# Patient Record
Sex: Female | Born: 1970 | Race: Black or African American | Hispanic: No | Marital: Married | Smoking: Never smoker
Health system: Southern US, Community
[De-identification: ages and names within clinical notes are randomized; demographics above are authoritative.]

## PROBLEM LIST (undated history)

## (undated) HISTORY — PX: REDUCTION MAMMAPLASTY: SUR839

---

## 2004-02-15 ENCOUNTER — Emergency Department (HOSPITAL_COMMUNITY): Admission: EM | Admit: 2004-02-15 | Discharge: 2004-02-15 | Payer: Self-pay | Admitting: Family Medicine

## 2004-05-23 ENCOUNTER — Emergency Department (HOSPITAL_COMMUNITY): Admission: EM | Admit: 2004-05-23 | Discharge: 2004-05-24 | Payer: Self-pay | Admitting: Emergency Medicine

## 2004-06-25 ENCOUNTER — Other Ambulatory Visit: Admission: RE | Admit: 2004-06-25 | Discharge: 2004-06-25 | Payer: Self-pay | Admitting: Internal Medicine

## 2004-12-14 ENCOUNTER — Encounter: Admission: RE | Admit: 2004-12-14 | Discharge: 2004-12-14 | Payer: Self-pay | Admitting: Internal Medicine

## 2007-05-16 ENCOUNTER — Emergency Department (HOSPITAL_COMMUNITY): Admission: EM | Admit: 2007-05-16 | Discharge: 2007-05-16 | Payer: Self-pay | Admitting: Emergency Medicine

## 2010-11-06 LAB — DIFFERENTIAL
Basophils Absolute: 0
Basophils Relative: 1
Eosinophils Absolute: 0.2
Eosinophils Relative: 5
Lymphocytes Relative: 33
Lymphs Abs: 1.3
Monocytes Absolute: 0.3
Monocytes Relative: 7
Neutro Abs: 2.1
Neutrophils Relative %: 55

## 2010-11-06 LAB — URINALYSIS, ROUTINE W REFLEX MICROSCOPIC
Bilirubin Urine: NEGATIVE
Glucose, UA: NEGATIVE
Hgb urine dipstick: NEGATIVE
Ketones, ur: NEGATIVE
Nitrite: NEGATIVE
Protein, ur: NEGATIVE
Specific Gravity, Urine: 1.023
Urobilinogen, UA: 0.2
pH: 6.5

## 2010-11-06 LAB — CBC
HCT: 40.4
Hemoglobin: 14.3
MCHC: 35.4
MCV: 82.6
Platelets: 209
RBC: 4.89
RDW: 14.1
WBC: 3.8 — ABNORMAL LOW

## 2010-11-06 LAB — COMPREHENSIVE METABOLIC PANEL
ALT: 18
AST: 19
Albumin: 3.9
Alkaline Phosphatase: 56
BUN: 5 — ABNORMAL LOW
CO2: 22
Calcium: 8.9
Chloride: 106
Creatinine, Ser: 0.68
GFR calc Af Amer: >60
GFR calc non Af Amer: >60
Glucose, Bld: 89
Potassium: 3.4 — ABNORMAL LOW
Sodium: 137
Total Bilirubin: 1
Total Protein: 7.2

## 2010-11-06 LAB — SEDIMENTATION RATE: Sed Rate: 6

## 2010-11-06 LAB — PREGNANCY, URINE: Preg Test, Ur: NEGATIVE

## 2013-06-24 ENCOUNTER — Other Ambulatory Visit: Payer: Self-pay

## 2013-06-24 DIAGNOSIS — Z1231 Encounter for screening mammogram for malignant neoplasm of breast: Secondary | ICD-10-CM

## 2013-08-09 ENCOUNTER — Ambulatory Visit
Admission: RE | Admit: 2013-08-09 | Discharge: 2013-08-09 | Disposition: A | Discharge status: Home / Self Care | Payer: PRIVATE HEALTH INSURANCE | Source: Ambulatory Visit

## 2013-08-09 DIAGNOSIS — Z1231 Encounter for screening mammogram for malignant neoplasm of breast: Secondary | ICD-10-CM

## 2013-08-20 ENCOUNTER — Encounter: Payer: Self-pay | Admitting: Gynecology

## 2014-09-27 ENCOUNTER — Other Ambulatory Visit: Payer: Self-pay

## 2014-09-27 DIAGNOSIS — Z1231 Encounter for screening mammogram for malignant neoplasm of breast: Secondary | ICD-10-CM

## 2014-09-28 ENCOUNTER — Ambulatory Visit: Payer: PRIVATE HEALTH INSURANCE

## 2014-09-28 ENCOUNTER — Ambulatory Visit
Admission: RE | Admit: 2014-09-28 | Discharge: 2014-09-28 | Disposition: A | Discharge status: Home / Self Care | Payer: PRIVATE HEALTH INSURANCE | Source: Ambulatory Visit

## 2014-09-28 DIAGNOSIS — Z1231 Encounter for screening mammogram for malignant neoplasm of breast: Secondary | ICD-10-CM

## 2014-09-30 ENCOUNTER — Other Ambulatory Visit: Payer: Self-pay | Admitting: Family Medicine

## 2014-09-30 DIAGNOSIS — R928 Other abnormal and inconclusive findings on diagnostic imaging of breast: Secondary | ICD-10-CM

## 2014-10-06 ENCOUNTER — Ambulatory Visit
Admission: RE | Admit: 2014-10-06 | Discharge: 2014-10-06 | Disposition: A | Discharge status: Home / Self Care | Payer: PRIVATE HEALTH INSURANCE | Source: Ambulatory Visit | Attending: Family Medicine | Admitting: Family Medicine

## 2014-10-06 ENCOUNTER — Other Ambulatory Visit: Payer: PRIVATE HEALTH INSURANCE

## 2014-10-06 DIAGNOSIS — R928 Other abnormal and inconclusive findings on diagnostic imaging of breast: Secondary | ICD-10-CM

## 2019-06-21 ENCOUNTER — Other Ambulatory Visit: Payer: Self-pay | Admitting: Family Medicine

## 2019-06-21 DIAGNOSIS — Z1231 Encounter for screening mammogram for malignant neoplasm of breast: Secondary | ICD-10-CM

## 2019-07-13 ENCOUNTER — Other Ambulatory Visit: Payer: Self-pay

## 2019-07-13 ENCOUNTER — Ambulatory Visit
Admission: RE | Admit: 2019-07-13 | Discharge: 2019-07-13 | Disposition: A | Discharge status: Home / Self Care | Payer: 59 | Source: Ambulatory Visit | Attending: Family Medicine | Admitting: Family Medicine

## 2019-07-13 DIAGNOSIS — Z1231 Encounter for screening mammogram for malignant neoplasm of breast: Secondary | ICD-10-CM

## 2019-07-14 ENCOUNTER — Other Ambulatory Visit: Payer: Self-pay | Admitting: Family Medicine

## 2019-07-14 DIAGNOSIS — R928 Other abnormal and inconclusive findings on diagnostic imaging of breast: Secondary | ICD-10-CM

## 2019-07-26 ENCOUNTER — Ambulatory Visit
Admission: RE | Admit: 2019-07-26 | Discharge: 2019-07-26 | Disposition: A | Discharge status: Home / Self Care | Payer: 59 | Source: Ambulatory Visit | Attending: Family Medicine | Admitting: Family Medicine

## 2019-07-26 ENCOUNTER — Other Ambulatory Visit: Payer: Self-pay

## 2019-07-26 DIAGNOSIS — R928 Other abnormal and inconclusive findings on diagnostic imaging of breast: Secondary | ICD-10-CM

## 2020-06-20 ENCOUNTER — Other Ambulatory Visit: Payer: Self-pay | Admitting: Family Medicine

## 2020-06-20 DIAGNOSIS — Z1231 Encounter for screening mammogram for malignant neoplasm of breast: Secondary | ICD-10-CM

## 2020-08-17 ENCOUNTER — Other Ambulatory Visit: Payer: Self-pay

## 2020-08-17 ENCOUNTER — Ambulatory Visit
Admission: RE | Admit: 2020-08-17 | Discharge: 2020-08-17 | Disposition: A | Discharge status: Home / Self Care | Payer: 59 | Source: Ambulatory Visit | Attending: Family Medicine | Admitting: Family Medicine

## 2020-08-17 DIAGNOSIS — Z1231 Encounter for screening mammogram for malignant neoplasm of breast: Secondary | ICD-10-CM

## 2020-11-15 ENCOUNTER — Encounter (HOSPITAL_BASED_OUTPATIENT_CLINIC_OR_DEPARTMENT_OTHER): Payer: Self-pay

## 2020-11-15 ENCOUNTER — Other Ambulatory Visit: Payer: Self-pay

## 2020-11-15 ENCOUNTER — Emergency Department (HOSPITAL_BASED_OUTPATIENT_CLINIC_OR_DEPARTMENT_OTHER): Payer: 59

## 2020-11-15 ENCOUNTER — Emergency Department (HOSPITAL_BASED_OUTPATIENT_CLINIC_OR_DEPARTMENT_OTHER)
Admission: EM | Admit: 2020-11-15 | Discharge: 2020-11-15 | Disposition: A | Discharge status: Home / Self Care | Payer: 59 | Attending: Emergency Medicine | Admitting: Emergency Medicine

## 2020-11-15 DIAGNOSIS — M79651 Pain in right thigh: Secondary | ICD-10-CM | POA: Diagnosis not present

## 2020-11-15 DIAGNOSIS — M79604 Pain in right leg: Secondary | ICD-10-CM | POA: Insufficient documentation

## 2020-11-15 DIAGNOSIS — M25561 Pain in right knee: Secondary | ICD-10-CM | POA: Diagnosis not present

## 2020-11-15 NOTE — ED Notes (Signed)
Patient transported to Ultrasound via wc by Radiology Staff

## 2020-11-15 NOTE — ED Triage Notes (Signed)
Pt c/o right knee pain for the past few days. Denies injury. States was "hard on her upper calf this morning". Denies pain at arrival

## 2020-11-15 NOTE — Discharge Instructions (Addendum)
You were seen in the emergency department today for right leg pain.  We did an ultrasound of your leg today that showed no blood clot.  It did comment on some inflammation around the tendon in your knee, which could be due to overuse.    Normally I treat this with the RICE method including rest, ice, compression, elevation.  So I would continue wearing the compression stocking that she have, you can consider icing your knee when you get home from work.  Also recommend over-the-counter anti-inflammatories such as ibuprofen, Tylenol, Aleve.  I would discuss this with your primary doctor at your appointment next week.   Continue to monitor how you're doing and return to the ER for new or worsening symptoms such as new pain, swelling, difficulty moving your knee or difficulty walking. It has been a pleasure seeing and caring for you today and I hope you start feeling better soon!

## 2020-11-15 NOTE — ED Provider Notes (Signed)
[facility] EMERGENCY DEPARTMENT Provider Note   CSN: [id number] Arrival date & time: 11/15/20  1826     History Chief Complaint  Patient presents with   Knee Pain    Laura Willis is a 50 y.o. female presents with right leg pain x3 days.  She states her pain came on gradually and has been worsening.  She describes it as "feeling like my leg feels 300 pounds" with tightness and swelling.  Pain is diffuse in her right thigh, right lateral knee, and posterior calf. She notes that she has had long episodes of sitting while at work.  Earlier she tried wearing a compression stocking over her right knee and states that this helped improve her symptoms.  She has not taken any medication for this.  No trauma to the area.  No history of blood clots.  She does not take oral contraceptives and denies any recent travel.  No fever, chills, chest pain, shortness of breath, cough.   Knee Pain Associated symptoms: no fever       History reviewed. No pertinent past medical history.  There are no problems to display for this patient.   Past Surgical History:  Procedure Laterality Date   REDUCTION MAMMAPLASTY       OB History   No obstetric history on file.     Family History  Problem Relation Age of Onset   Breast cancer Sister 72    Social History   Tobacco Use   Smoking status: Never   Smokeless tobacco: Never  Substance Use Topics   Alcohol use: Never   Drug use: Never    Home Medications Prior to Admission medications   Not on File    Allergies    Patient has no known allergies.  Review of Systems   Review of Systems  Constitutional:  Negative for chills and fever.  Respiratory:  Negative for shortness of breath.   Cardiovascular:  Negative for chest pain.  Musculoskeletal:  Negative for gait problem.       Right thigh, knee, and calf pain  All other systems reviewed and are negative.  Physical Exam Updated Vital Signs BP (!) 159/108 (BP Location:  Right Arm)   Pulse 80   Temp 98.2 F (36.8 C) (Oral)   Resp 18   Ht 5\' 3"  (1.6 m)   Wt 93 kg   SpO2 100%   BMI 36.31 kg/m   Physical Exam Vitals and nursing note reviewed.  Constitutional:      Appearance: Normal appearance.  HENT:     Head: Normocephalic and atraumatic.  Eyes:     Conjunctiva/sclera: Conjunctivae normal.  Cardiovascular:     Rate and Rhythm: Normal rate and regular rhythm.  Pulmonary:     Effort: Pulmonary effort is normal. No respiratory distress.     Breath sounds: Normal breath sounds.  Abdominal:     General: There is no distension.     Palpations: Abdomen is soft.     Tenderness: There is no abdominal tenderness.  Musculoskeletal:     Comments: Full passive ROM of right knee, 5/5 strength. Sensation in tact. Pulses equal and normal in bilateral lower extremities. Mild swelling to the lateral knee without overlying signs of infection. No increased redness, warmth or tenderness. No deformities palpated.   Skin:    General: Skin is warm and dry.  Neurological:     General: No focal deficit present.     Mental Status: She is alert.  Psychiatric:  Mood and Affect: Mood normal.        Behavior: Behavior normal.    ED Results / Procedures / Treatments   Labs (all labs ordered are listed, but only abnormal results are displayed) Labs Reviewed - No data to display  EKG None  Radiology US Venous Img Lower Right (DVT Study)  Result Date: 11/15/2020 CLINICAL DATA:  Right leg pain and swelling. EXAM: Right LOWER EXTREMITY VENOUS DOPPLER ULTRASOUND TECHNIQUE: Gray-scale sonography with compression, as well as color and duplex ultrasound, were performed to evaluate the deep venous system(s) from the level of the common femoral vein through the popliteal and proximal calf veins. COMPARISON:  None. FINDINGS: VENOUS Normal compressibility of the common femoral, superficial femoral, and popliteal veins, as well as the visualized calf veins. Visualized  portions of profunda femoral vein and great saphenous vein unremarkable. No filling defects to suggest DVT on grayscale or color Doppler imaging. Doppler waveforms show normal direction of venous flow, normal respiratory plasticity and response to augmentation. Limited views of the contralateral common femoral vein are unremarkable. OTHER Likely 5 by 1 x 4.5 Cm hematoma formation within an anterior knee tendon. Limitations: none IMPRESSION: 1. Likely hematoma formation within an anterior knee tendon with concern for injury/rupture. 2. No right lower extremity deep venous thrombosis. Electronically Signed   By: Tish Frederickson M.D.   On: 11/15/2020 21:14    Procedures Procedures   Medications Ordered in ED Medications - No data to display  ED Course  I have reviewed the triage vital signs and the nursing notes.  Pertinent labs & imaging results that were available during my care of the patient were reviewed by me and considered in my medical decision making (see chart for details).    MDM Rules/Calculators/A&P                           Patient is 50 y/o female who presents with right leg pain x3 days.  Pain came on gradually and has been worsening.  She denies trauma or injury to the area.  She endorses long periods of sitting at work.  Has tried compression stocking over the area with mild relief. No fevers, chest pain, or shortness of breath.   On exam there is full ROM of right knee and 5/5 strength. Sensation in tact. Pulses equal and normal in BLE. Mild swelling over lateral right knee without signs of infection. Exam not consistent with septic joint as there is no increased warmth, tenderness, or skin changes.   [place] for DVT showed no evidence of clot. Some suspicion for hematoma over anterior knee tendon. History and exam not concerning for acute tendon injury or rupture, so this may be incidental finding. Symptoms likely could be caused by overuse. She is not requiring admission or patient  treatment for symptoms at this time.  Plan to treat with rest, ice, compression, and elevation as well as over-the-counter anti-inflammatories.  Patient has PCP appointment on Monday to discuss if symptoms persist.  Discussed reasons to return to the emergency department.  Patient agreeable to plan.  Final Clinical Impression(s) / ED Diagnoses Final diagnoses:  Right leg pain    Rx / DC Orders ED Discharge Orders     None        Lavonn Maxcy, Lora Paula, PA-C 11/15/20 2240    Arby Barrette, MD 11/29/20 1640

## 2020-11-15 NOTE — ED Notes (Signed)
AVS copy provided to client, reviewed AVS and reinforced instructions of resting, elevation and utilizing ice to aid in comfort measures. Opportunity for questions provided prior to DC to home.

## 2021-03-26 IMAGING — MG DIGITAL SCREENING BILAT W/ TOMO W/ CAD
8 series · 8 of 24 positions shown · non-contrast
Comparison: Previous exam(s).

CLINICAL DATA: Screening.

EXAM:
DIGITAL SCREENING BILATERAL MAMMOGRAM WITH TOMO AND CAD

[L CC synth-2D]
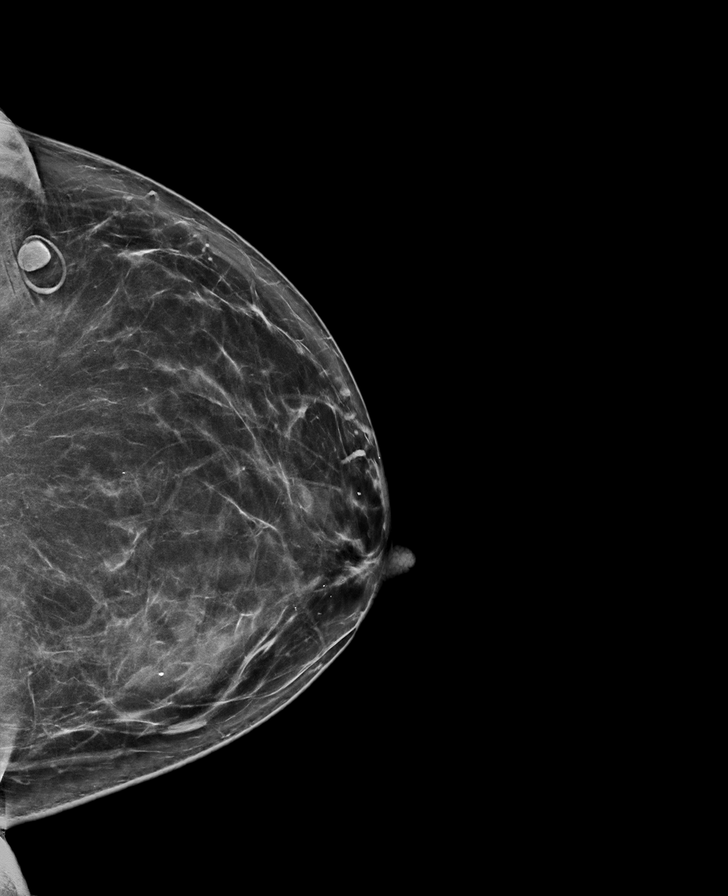

[R MLO synth-2D]
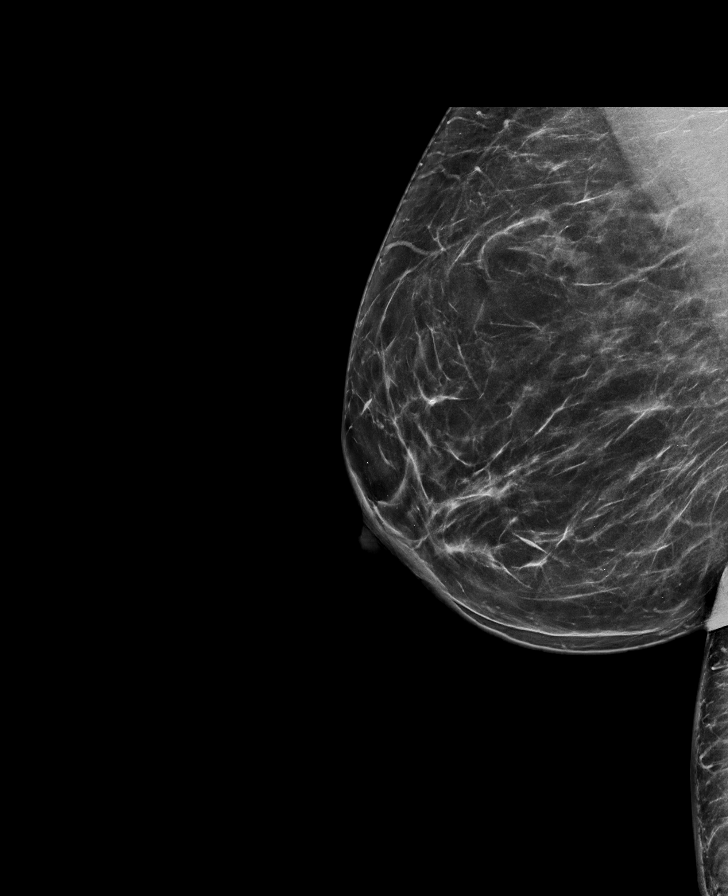

[R CC synth-2D]
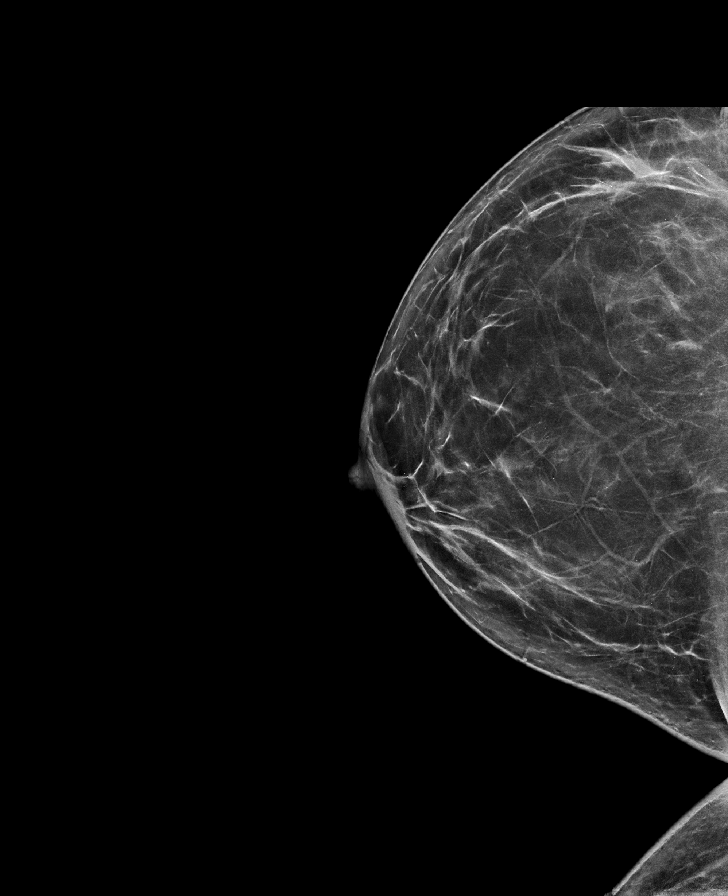

[L MLO synth-2D]
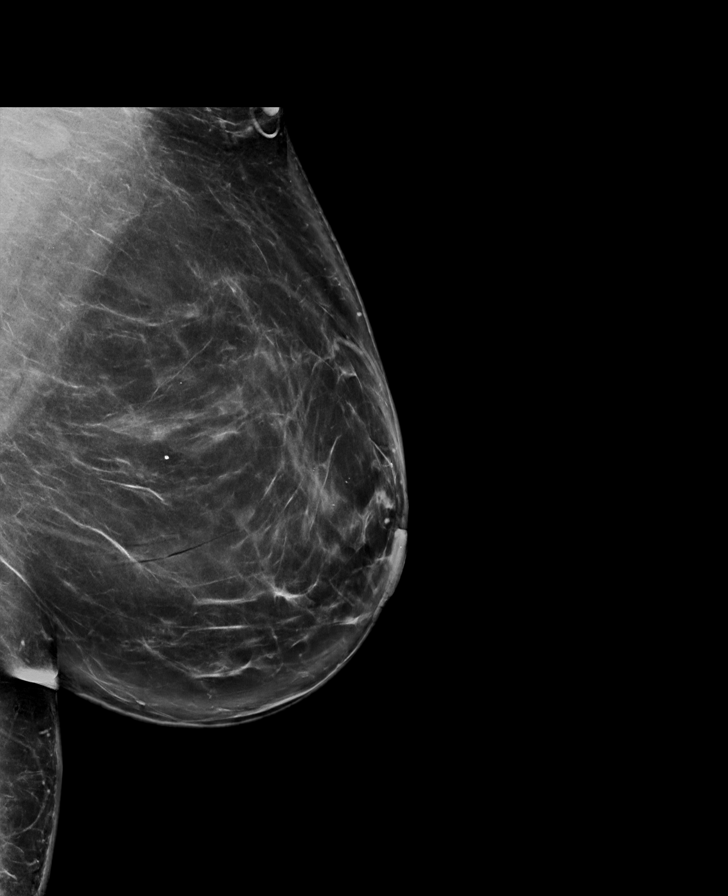

[L MLO tomo · tomo slice 51/101.0]
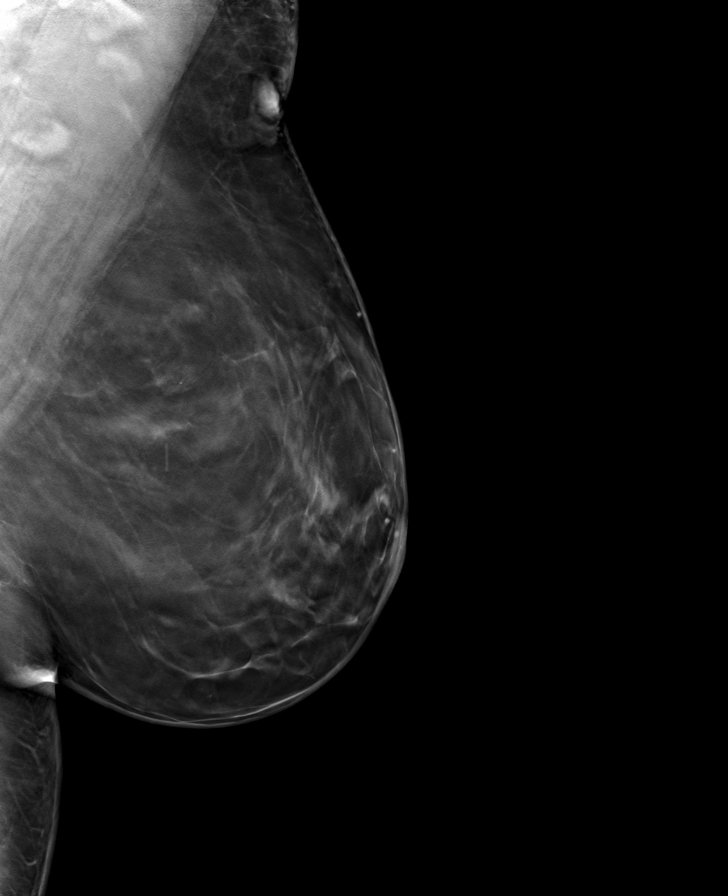

[R MLO tomo · tomo slice 41/81.0]
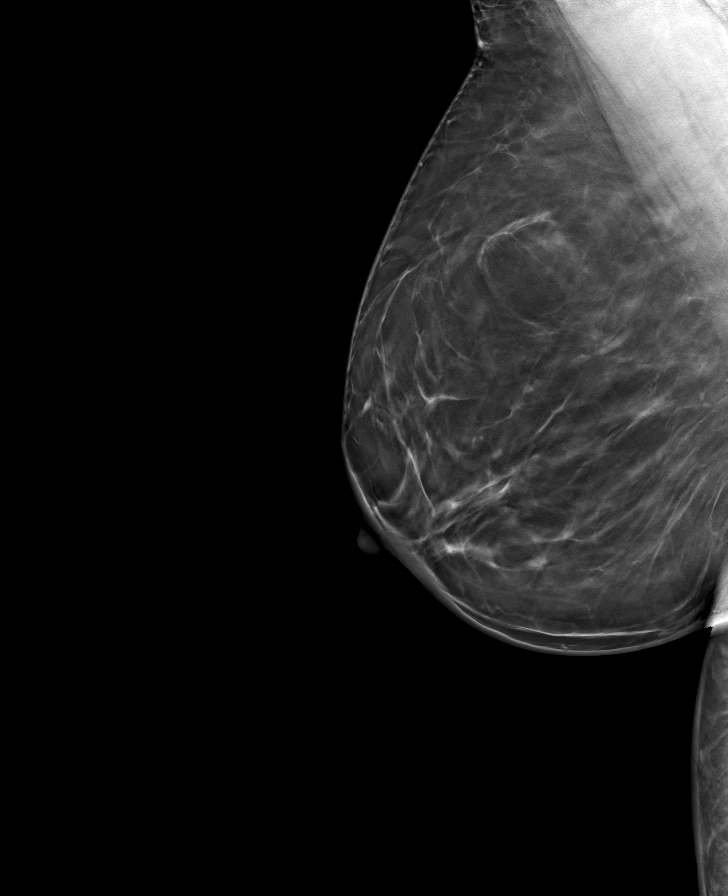

[R CC tomo · tomo slice 39/76.0]
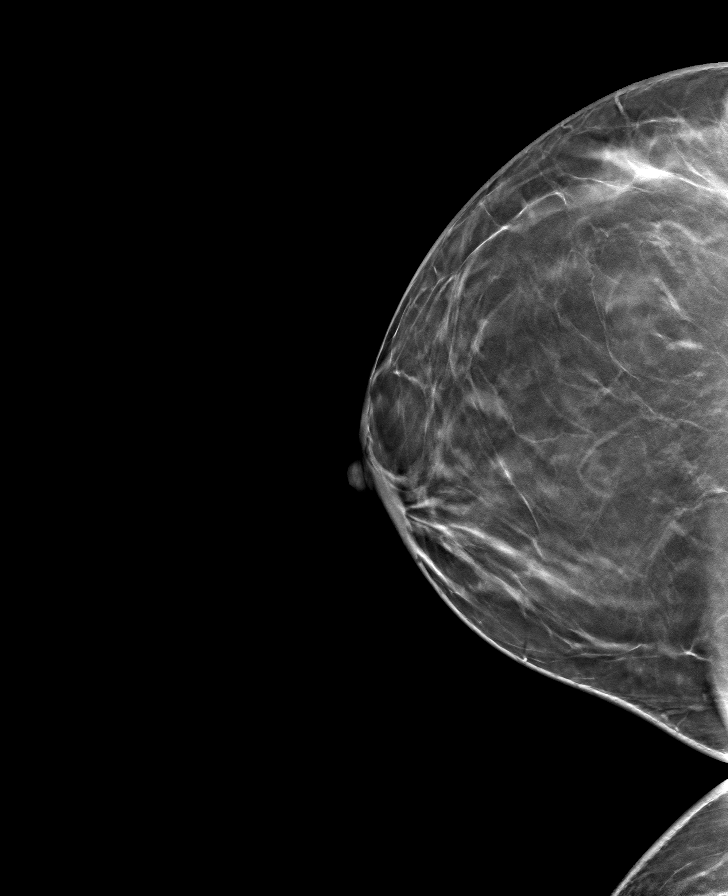

[L CC tomo · tomo slice 41/82.0]
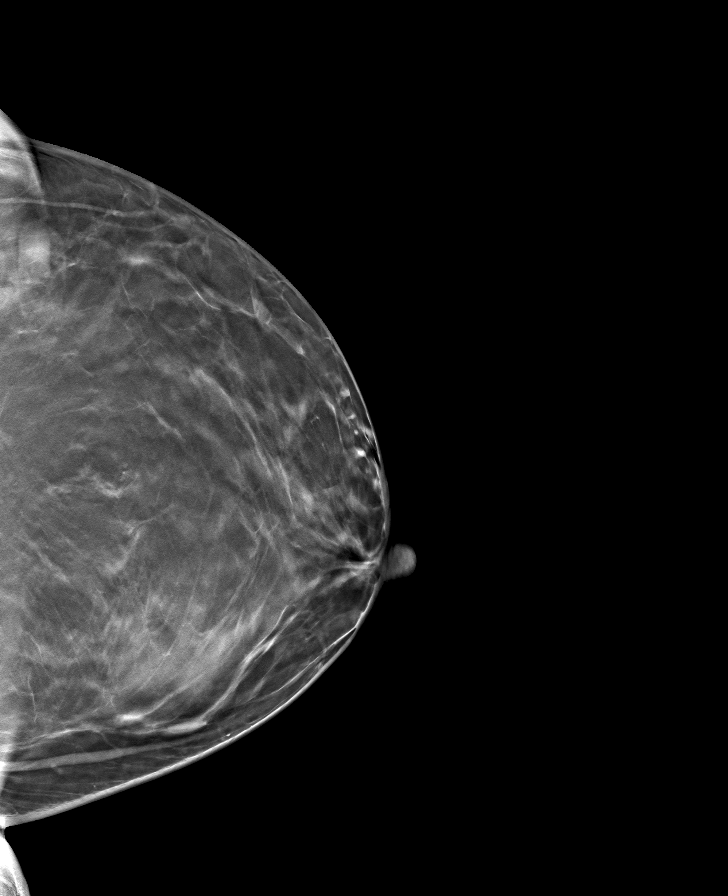

[8 of 24 positions shown; findings below may reference images not displayed]

ACR Breast Density Category c: The breast tissue is heterogeneously
dense, which may obscure small masses.
FINDINGS: In the left breast, a possible asymmetry warrants further
evaluation. In the right breast, no findings suspicious for
malignancy. Images were processed with CAD.
IMPRESSION: Further evaluation is suggested for possible asymmetry in the left
breast.

RECOMMENDATION:
Diagnostic mammogram and possibly ultrasound of the left breast.
(Code:[id number]-881)

The patient will be contacted regarding the findings, and additional
imaging will be scheduled.

BI-RADS CATEGORY  0: Incomplete. Need additional imaging evaluation
and/or prior mammograms for comparison.

## 2021-04-08 IMAGING — US US BREAST*L* LIMITED INC AXILLA
1 series · 7 of 7 positions shown · non-contrast
Comparison: Previous exam(s).

CLINICAL DATA: The patient was called back for a possible left
breast asymmetry.

EXAM:
DIGITAL DIAGNOSTIC LEFT MAMMOGRAM WITH TOMO
ULTRASOUND LEFT BREAST

[Series 1: us breast*left* limited inc axilla · 0.07mm/px · 7 of 7 slices shown]
[im 1/7]
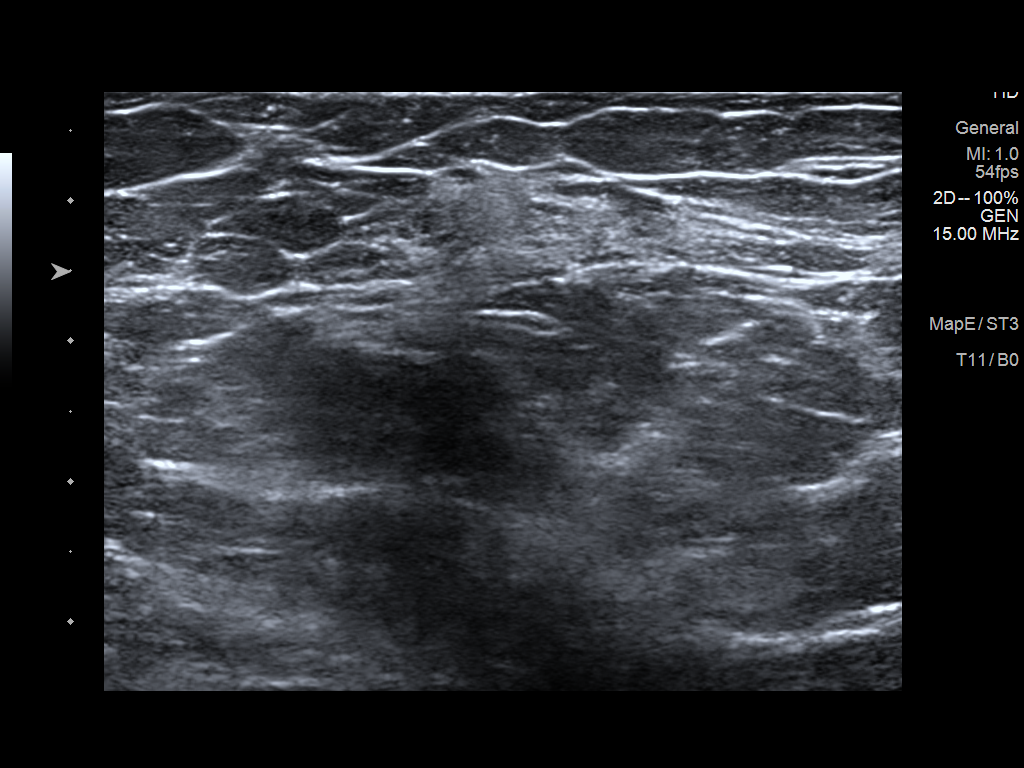
[im 2/7]
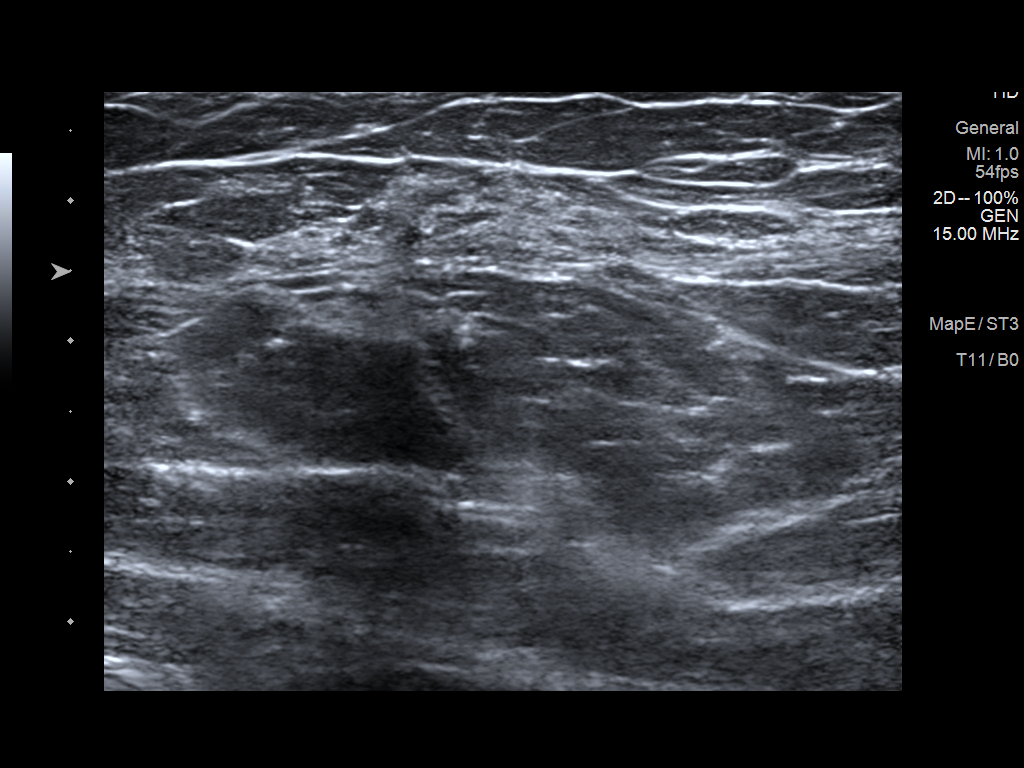
[im 3/7]
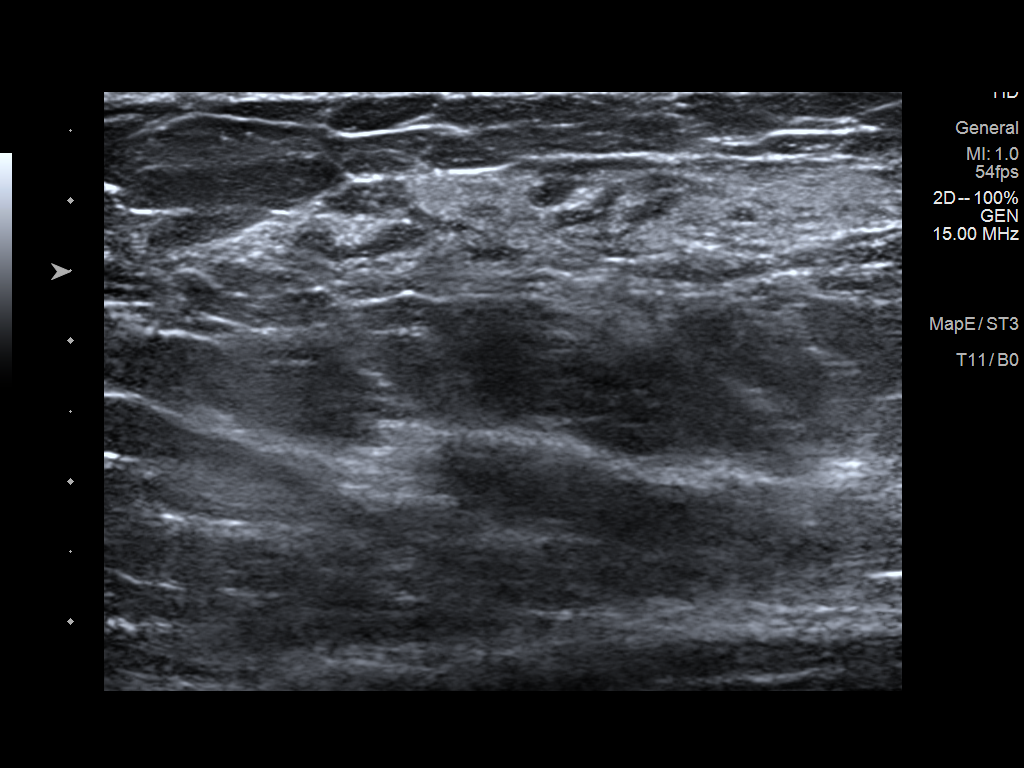
[im 4/7]
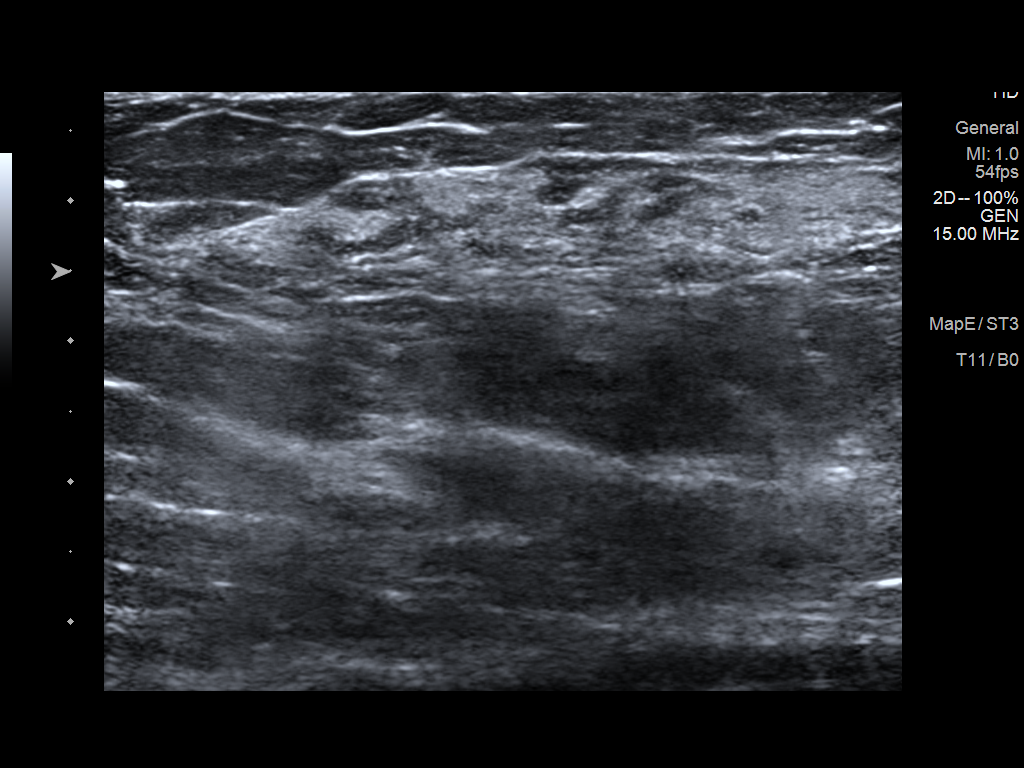
[im 5/7]
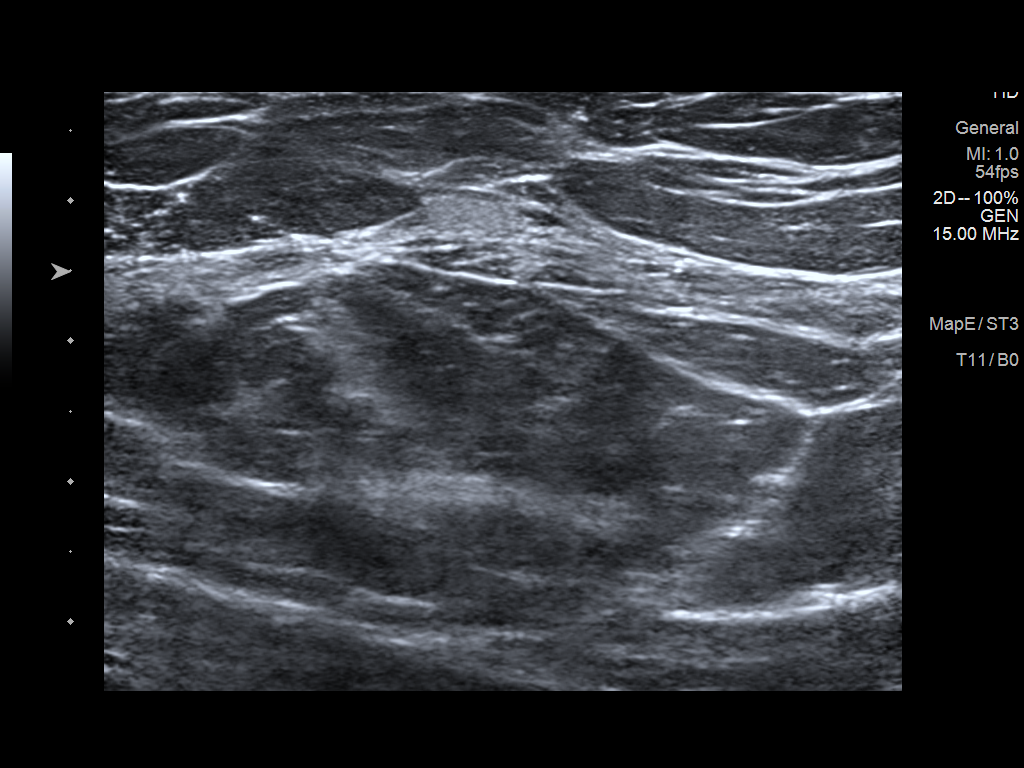
[im 6/7]
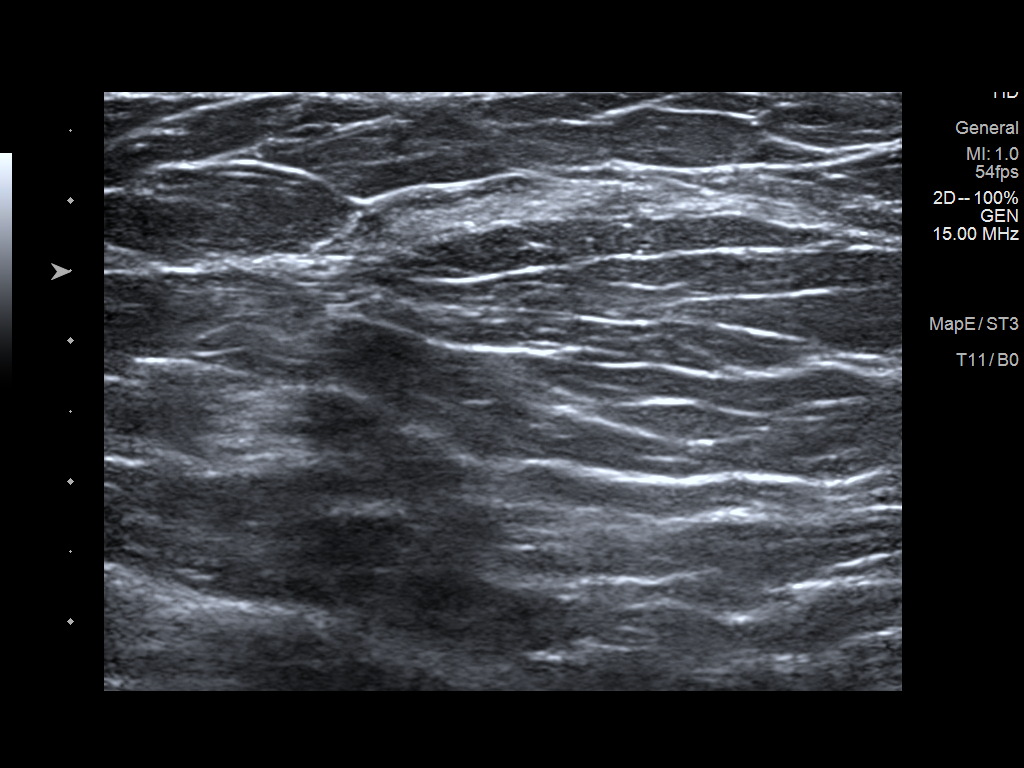
[im 7/7]
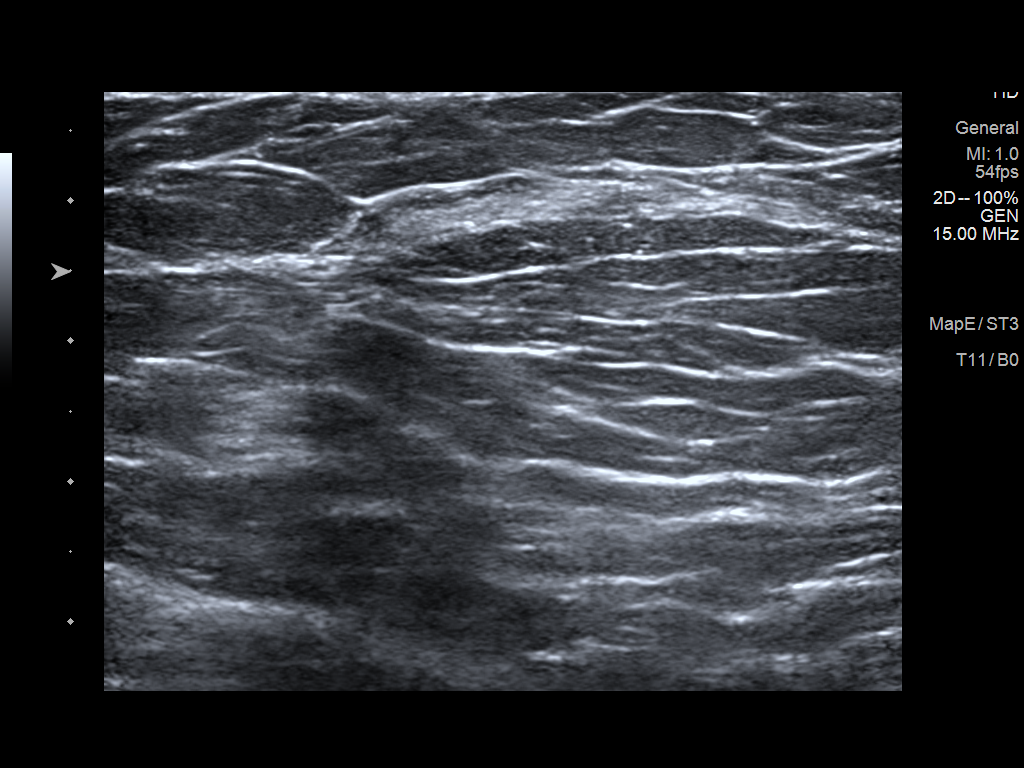

[7 of 7 positions shown; findings below may reference images not displayed]

ACR Breast Density Category b: There are scattered areas of
fibroglandular density.
FINDINGS: The left breast asymmetry improves on today's imaging, unchanged
since 7883.

On physical exam, no suspicious lumps are identified.

Targeted ultrasound is performed, showing no sonographic
abnormalities.
IMPRESSION: Benign, stable left breast asymmetry, unchanged for approximately 5
years.

RECOMMENDATION:
Annual screening mammography.

I have discussed the findings and recommendations with the patient.
If applicable, a reminder letter will be sent to the patient
regarding the next appointment.

BI-RADS CATEGORY  2: Benign.

## 2021-07-19 ENCOUNTER — Other Ambulatory Visit: Payer: Self-pay | Admitting: Family Medicine

## 2021-07-19 DIAGNOSIS — Z1231 Encounter for screening mammogram for malignant neoplasm of breast: Secondary | ICD-10-CM

## 2021-08-21 ENCOUNTER — Ambulatory Visit
Admission: RE | Admit: 2021-08-21 | Discharge: 2021-08-21 | Disposition: A | Discharge status: Home / Self Care | Payer: 59 | Source: Ambulatory Visit | Attending: Family Medicine | Admitting: Family Medicine

## 2021-08-21 DIAGNOSIS — Z1231 Encounter for screening mammogram for malignant neoplasm of breast: Secondary | ICD-10-CM

## 2022-07-30 IMAGING — US US EXTREM LOW VENOUS*R*
1 series · 14 of 24 positions shown · non-contrast
Comparison: None.

CLINICAL DATA: Right leg pain and swelling.

EXAM:
Right LOWER EXTREMITY VENOUS DOPPLER ULTRASOUND
TECHNIQUE: Gray-scale sonography with compression, as well as color and duplex
ultrasound, were performed to evaluate the deep venous system(s)
from the level of the common femoral vein through the popliteal and
proximal calf veins.

[Series 1: us extrem low venous*right* · 56 acquisitions, 14 frames shown]
[im 1/56]
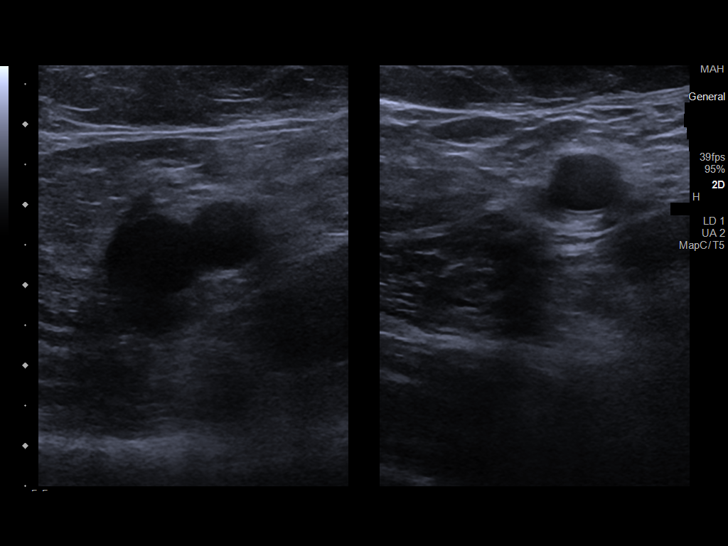
[im 5/56]
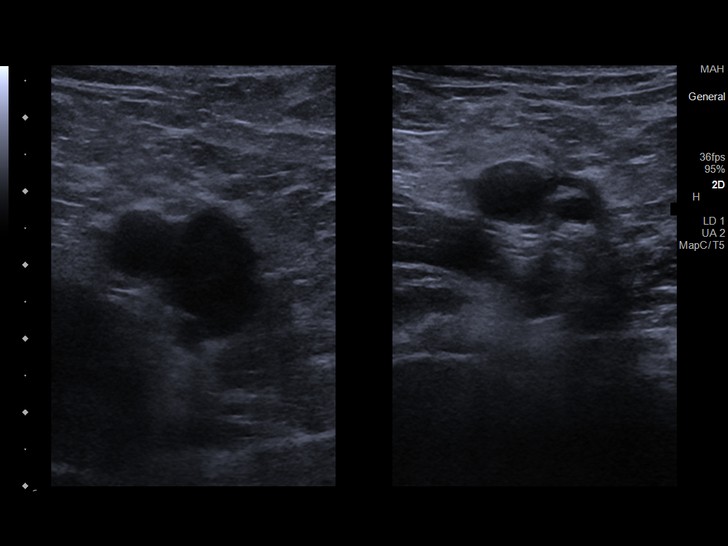
[im 10/56]
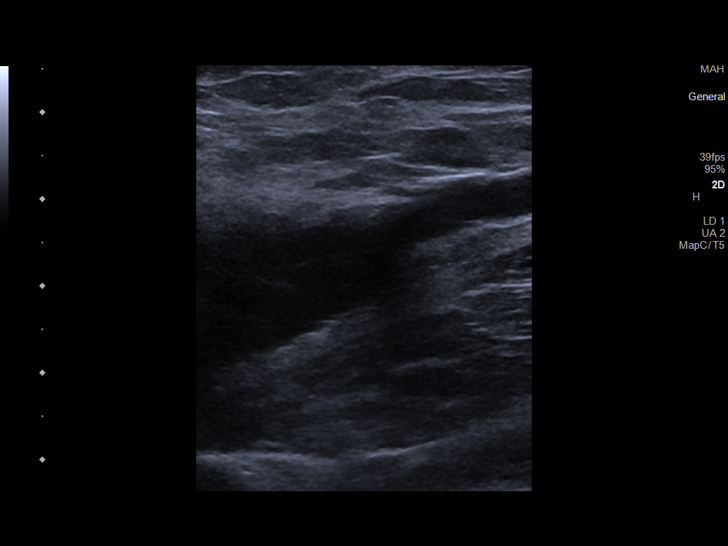
[im 17/56]
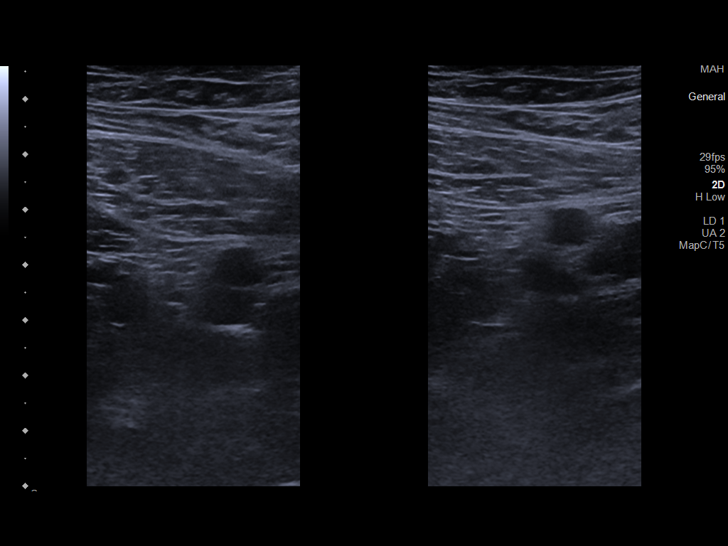
[im 20/56]
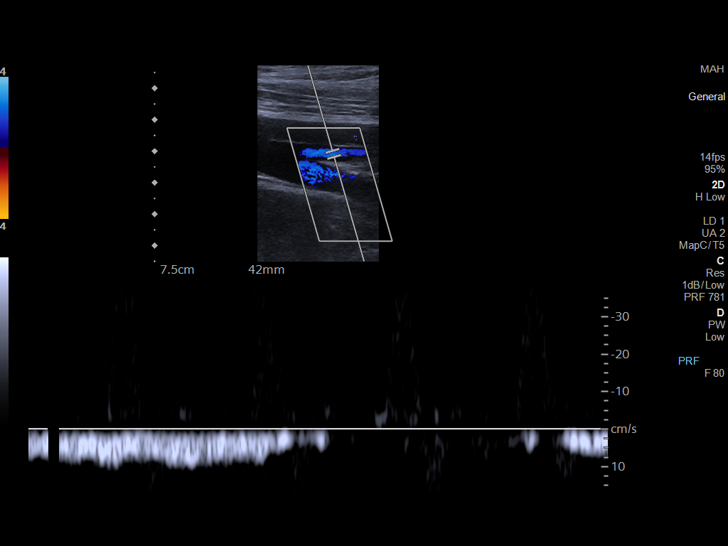
[im 24/56]
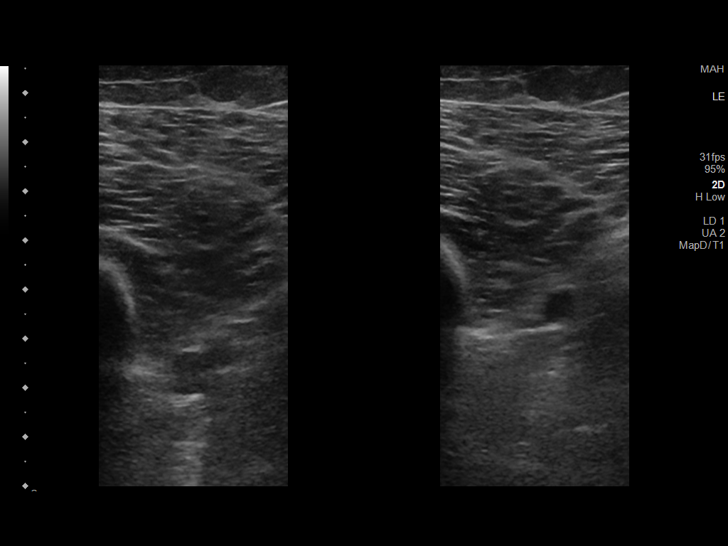
[im 29/56]
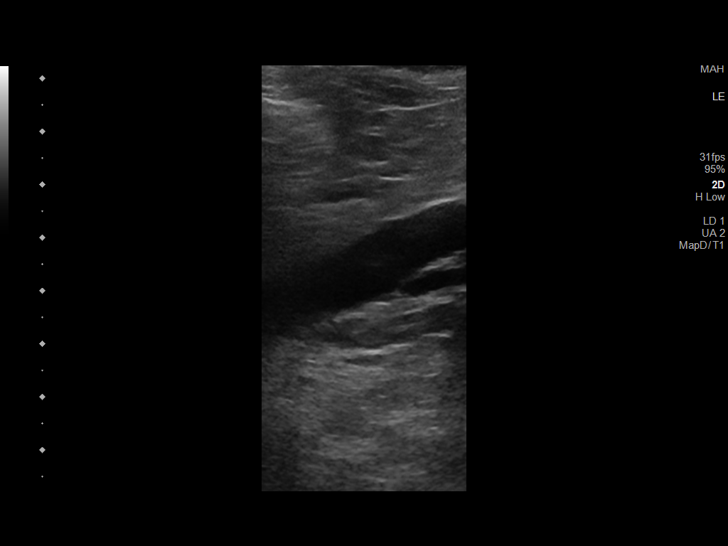
[im 32/56]
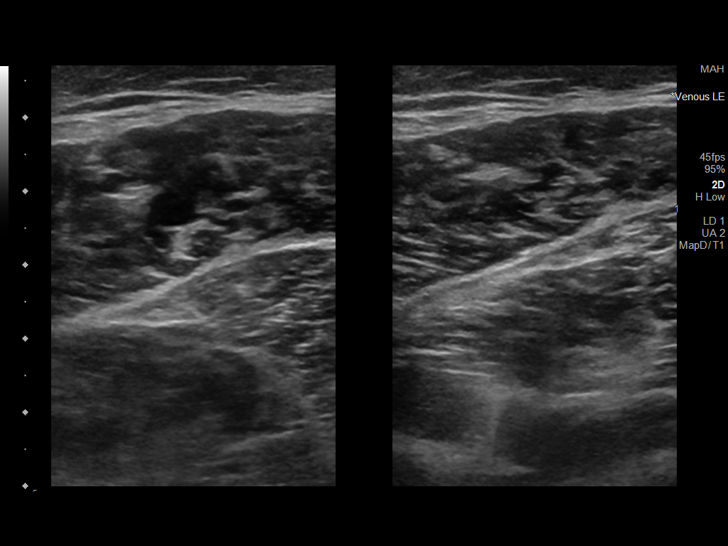
[im 36/56]
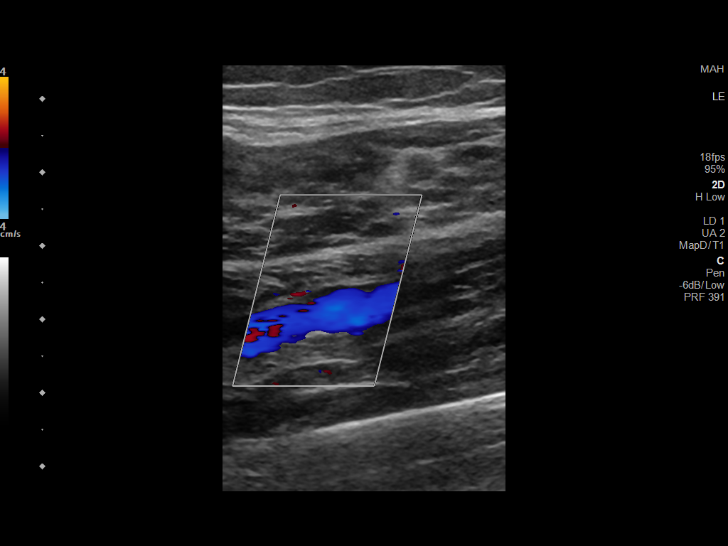
[im 41/56]
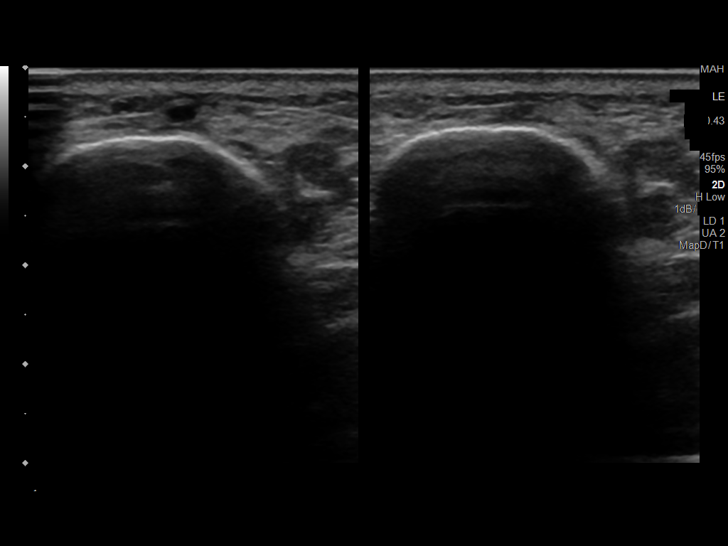
[im 46/56]
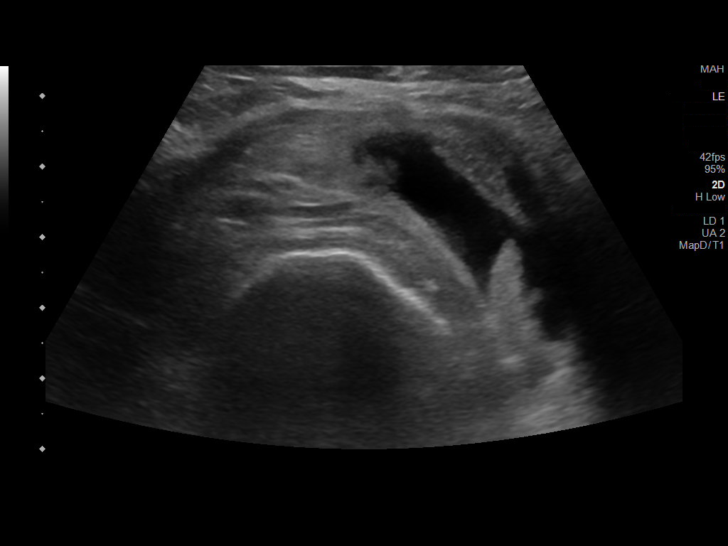
[im 48/56]
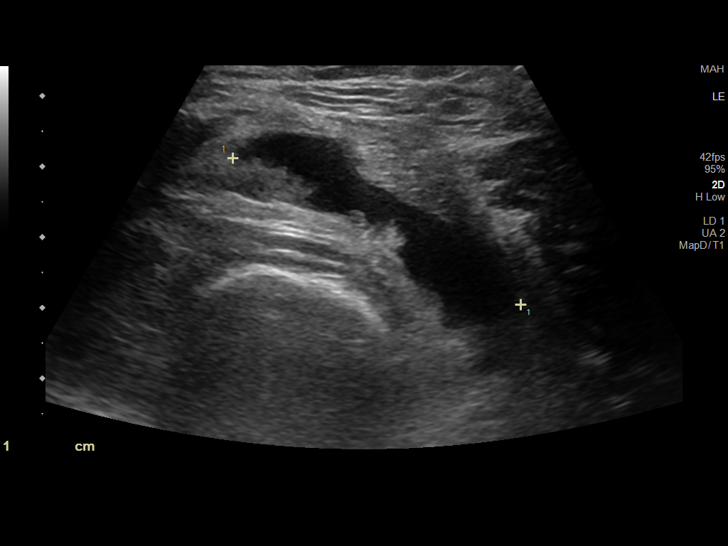
[im 53/56]
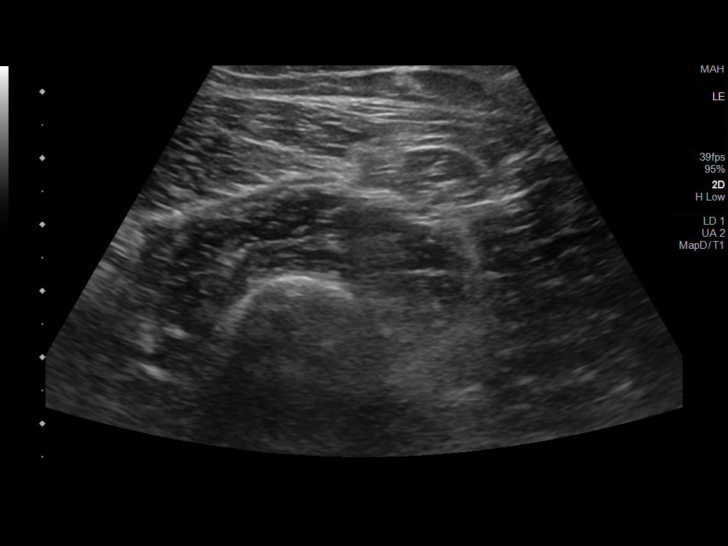
[im 56/56]
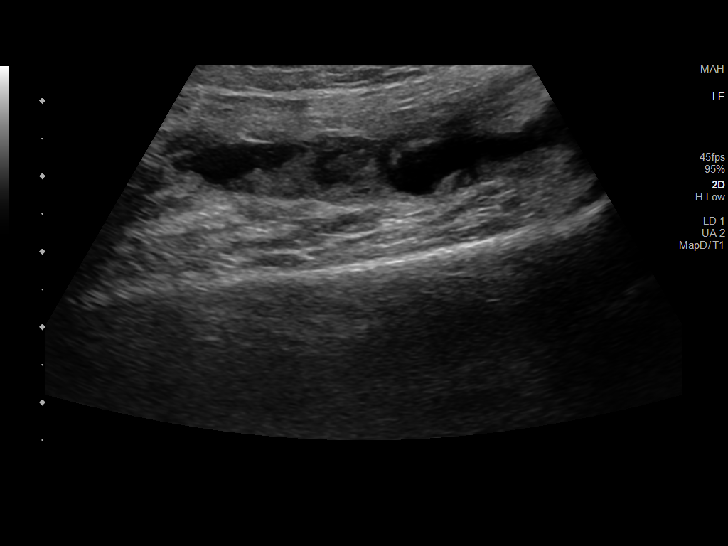

[14 of 24 positions shown; findings below may reference images not displayed]

FINDINGS: VENOUS

Normal compressibility of the common femoral, superficial femoral,
and popliteal veins, as well as the visualized calf veins.
Visualized portions of profunda femoral vein and great saphenous
vein unremarkable. No filling defects to suggest DVT on grayscale or
color Doppler imaging. Doppler waveforms show normal direction of
venous flow, normal respiratory plasticity and response to
augmentation.

Limited views of the contralateral common femoral vein are
unremarkable.

OTHER

Likely 5 by 1 x 4.5 Cm hematoma formation within an anterior knee
tendon.

Limitations: none
IMPRESSION: 1. Likely hematoma formation within an anterior knee tendon with
concern for injury/rupture.
2. No right lower extremity deep venous thrombosis.

## 2022-09-12 ENCOUNTER — Other Ambulatory Visit: Payer: Self-pay | Admitting: Family Medicine

## 2022-09-12 DIAGNOSIS — Z1231 Encounter for screening mammogram for malignant neoplasm of breast: Secondary | ICD-10-CM

## 2022-09-24 ENCOUNTER — Ambulatory Visit: Payer: 59

## 2022-10-01 ENCOUNTER — Ambulatory Visit
Admission: RE | Admit: 2022-10-01 | Discharge: 2022-10-01 | Disposition: A | Discharge status: Home / Self Care | Payer: Managed Care, Other (non HMO) | Source: Ambulatory Visit | Attending: Family Medicine | Admitting: Family Medicine

## 2022-10-01 DIAGNOSIS — Z1231 Encounter for screening mammogram for malignant neoplasm of breast: Secondary | ICD-10-CM

## 2023-10-08 ENCOUNTER — Other Ambulatory Visit: Payer: Self-pay | Admitting: Family Medicine

## 2023-10-08 DIAGNOSIS — Z1231 Encounter for screening mammogram for malignant neoplasm of breast: Secondary | ICD-10-CM

## 2023-10-30 ENCOUNTER — Ambulatory Visit

## 2023-11-10 ENCOUNTER — Ambulatory Visit
Admission: RE | Admit: 2023-11-10 | Discharge: 2023-11-10 | Disposition: A | Source: Ambulatory Visit | Attending: Family Medicine | Admitting: Family Medicine

## 2023-11-10 DIAGNOSIS — Z1231 Encounter for screening mammogram for malignant neoplasm of breast: Secondary | ICD-10-CM
